# Patient Record
Sex: Male | Born: 1980 | Race: White | Hispanic: No | Marital: Single | Smoking: Never smoker
Health system: Southern US, Community
[De-identification: ages and names within clinical notes are randomized; demographics above are authoritative.]

## PROBLEM LIST (undated history)

## (undated) DIAGNOSIS — K5190 Ulcerative colitis, unspecified, without complications: Secondary | ICD-10-CM

## (undated) DIAGNOSIS — K6389 Other specified diseases of intestine: Secondary | ICD-10-CM

## (undated) DIAGNOSIS — K602 Anal fissure, unspecified: Secondary | ICD-10-CM

## (undated) DIAGNOSIS — J45909 Unspecified asthma, uncomplicated: Secondary | ICD-10-CM

## (undated) HISTORY — DX: Other specified diseases of intestine: K63.89

## (undated) HISTORY — PX: NO PAST SURGERIES: SHX2092

## (undated) HISTORY — DX: Anal fissure, unspecified: K60.2

## (undated) HISTORY — DX: Unspecified asthma, uncomplicated: J45.909

## (undated) HISTORY — DX: Ulcerative colitis, unspecified, without complications: K51.90

---

## 2005-02-01 ENCOUNTER — Ambulatory Visit (HOSPITAL_COMMUNITY): Admission: RE | Admit: 2005-02-01 | Discharge: 2005-02-01 | Payer: Self-pay | Admitting: Gastroenterology

## 2010-05-28 ENCOUNTER — Encounter: Payer: Self-pay | Admitting: Gastroenterology

## 2018-02-05 ENCOUNTER — Other Ambulatory Visit: Payer: Self-pay | Admitting: Occupational Medicine

## 2018-02-05 ENCOUNTER — Ambulatory Visit: Payer: Self-pay

## 2018-02-05 DIAGNOSIS — M25572 Pain in left ankle and joints of left foot: Secondary | ICD-10-CM

## 2019-07-29 ENCOUNTER — Encounter: Payer: Self-pay | Admitting: Gastroenterology

## 2019-08-04 ENCOUNTER — Encounter: Payer: Self-pay | Admitting: Gastroenterology

## 2019-08-04 ENCOUNTER — Ambulatory Visit: Payer: BLUE CROSS/BLUE SHIELD | Admitting: Gastroenterology

## 2019-08-04 VITALS — BP 108/70 | HR 72 | Temp 98.1°F | Ht 69.5 in | Wt 194.4 lb

## 2019-08-04 DIAGNOSIS — R152 Fecal urgency: Secondary | ICD-10-CM | POA: Diagnosis not present

## 2019-08-04 DIAGNOSIS — R194 Change in bowel habit: Secondary | ICD-10-CM

## 2019-08-04 DIAGNOSIS — K625 Hemorrhage of anus and rectum: Secondary | ICD-10-CM | POA: Diagnosis not present

## 2019-08-04 MED ORDER — NA SULFATE-K SULFATE-MG SULF 17.5-3.13-1.6 GM/177ML PO SOLN: 1.0000 | Freq: Once | ORAL | 0 refills | Status: AC

## 2019-08-04 NOTE — Patient Instructions (Addendum)
If you are age 40 or older, your body mass index should be between 23-30. Your Body mass index is 28.29 kg/m. If this is out of the aforementioned range listed, please consider follow up with your Primary Care Provider.  If you are age 40 or younger, your body mass index should be between 19-25. Your Body mass index is 28.29 kg/m. If this is out of the aformentioned range listed, please consider follow up with your Primary Care Provider.   We have sent the following medications to your pharmacy for you to pick up at your convenience:  Suprep (colonsocopy prep)  You have been scheduled for a colonoscopy. Please follow written instructions given to you at your visit today.  Please pick up your prep supplies at the pharmacy within the next 1-3 days. If you use inhalers (even only as needed), please bring them with you on the day of your procedure.  Due to recent changes in healthcare laws, you may see the results of your imaging and laboratory studies on MyChart before your provider has had a chance to review them.  We understand that in some cases there may be results that are confusing or concerning to you. Not all laboratory results come back in the same time frame and the provider may be waiting for multiple results in order to interpret others.  Please give [place] 48 hours in order for your provider to thoroughly review all the results before contacting the office for clarification of your results.

## 2019-08-04 NOTE — Progress Notes (Signed)
08/04/2019 Albert Morton [id number] Dec 21, 1980   HISTORY OF PRESENT ILLNESS: This is a pleasant 39 year old male who is new to our office.  He presents here today as a self-referral for complaints of rectal bleeding.  He tells me that he had a fissure several years ago.  Now for quite some time he will see bright red blood on the toilet paper upon wiping.  He says that sometimes he won't see it for a while, but then it comes back again.  Sometimes drips into the toilet bowl.  He tells me that 1 day last week while he was at work he felt he had have a bowel movement.  As he was walking to the bathroom he passed some gas and when he got there he noticed a moderate amount of blood in his underwear.  That was the only episode of bleeding to that degree.  He denies any rectal pain.  He denies any abdominal pain.  He really denies any diarrhea.  He says that he has had a change, however, as he used to have a bowel movement every day and lately over the past few months he will sometimes go a couple of days without a bowel movement, but then also describes urgency.  He says that when he has to go he really has to go.   Past Medical History:  Diagnosis Date  . Anal fissure   . Asthma      Past Surgical History:  Procedure Laterality Date  . NO PAST SURGERIES      reports that he has never smoked. He has never used smokeless tobacco. He reports that he does not drink alcohol or use drugs. family history includes Allergies in his son; Asthma in his son; Breast cancer in his maternal grandmother; Deep vein thrombosis in his mother; Diabetes in his father; Hypertension in his father; Prostate cancer in his maternal grandfather. No Known Allergies    No outpatient encounter medications on file as of 08/04/2019.   No facility-administered encounter medications on file as of 08/04/2019.     REVIEW OF SYSTEMS  : All other systems reviewed and negative except where noted in the History of Present  Illness.   PHYSICAL EXAM: BP 108/70 (BP Location: Left Arm, Patient Position: Sitting, Cuff Size: Normal)   Pulse 72   Temp 98.1 F (36.7 C)   Ht 5' 9.5" (1.765 m) Comment: height measured without shoes  Wt 194 lb 6 oz (88.2 kg)   BMI 28.29 kg/m  General: Well developed white male in no acute distress Head: Normocephalic and atraumatic Eyes:  Sclerae anicteric, conjunctiva pink. Ears: Normal auditory acuity Lungs: Clear throughout to auscultation; no increased WOB. Heart: Regular rate and rhythm; no M/R/G. Abdomen: Soft, non-distended.  BS present.  Non-tender. Rectal:  Will be done at the time of colonoscopy. Musculoskeletal: Symmetrical with no gross deformities  Skin: No lesions on visible extremities Extremities: No edema  Neurological: Alert oriented x 4, grossly non-focal Psychological:  Alert and cooperative. Normal mood and affect  ASSESSMENT AND PLAN: *Rectal bleeding:  Had one episode last week of passing a moderate amount of blood per rectum, but sees blood regularly when wiping. *Change in bowel habits:  Says that he used to have a BM every day, but now sometimes does not go for a day or so.  No diarrhea but a lot of urgency at times.    **Will plan for colonoscopy with Dr. Fuller Plan.  The risks, benefits, and  alternatives to colonoscopy were discussed with the patient and he consents to proceed.   CC:  No ref. provider found

## 2019-08-05 NOTE — Progress Notes (Signed)
Reviewed and agree with management plan.  Faithlyn Recktenwald T. Juliene Kirsh, MD FACG [facility]  

## 2019-08-07 ENCOUNTER — Ambulatory Visit: Payer: Self-pay | Admitting: Gastroenterology

## 2019-09-02 ENCOUNTER — Telehealth: Payer: Self-pay | Admitting: Gastroenterology

## 2019-09-02 MED ORDER — NA SULFATE-K SULFATE-MG SULF 17.5-3.13-1.6 GM/177ML PO SOLN: 1.0000 | Freq: Once | ORAL | 0 refills | Status: AC

## 2019-09-02 NOTE — Telephone Encounter (Signed)
Patient's wife informed that Suprep was sent in with a discount code that should decrease the price to $50. Wife voiced understanding.

## 2019-09-09 ENCOUNTER — Encounter: Payer: Self-pay | Admitting: Gastroenterology

## 2019-09-11 ENCOUNTER — Ambulatory Visit (INDEPENDENT_AMBULATORY_CARE_PROVIDER_SITE_OTHER): Payer: BC Managed Care – PPO

## 2019-09-11 ENCOUNTER — Other Ambulatory Visit: Payer: Self-pay | Admitting: Gastroenterology

## 2019-09-11 DIAGNOSIS — Z1159 Encounter for screening for other viral diseases: Secondary | ICD-10-CM

## 2019-09-11 LAB — SARS CORONAVIRUS 2 (TAT 6-24 HRS): SARS Coronavirus 2: NEGATIVE

## 2019-09-15 ENCOUNTER — Other Ambulatory Visit: Payer: Self-pay

## 2019-09-15 ENCOUNTER — Ambulatory Visit (AMBULATORY_SURGERY_CENTER): Payer: BC Managed Care – PPO | Admitting: Gastroenterology

## 2019-09-15 ENCOUNTER — Encounter: Payer: BLUE CROSS/BLUE SHIELD | Admitting: Gastroenterology

## 2019-09-15 ENCOUNTER — Encounter: Payer: Self-pay | Admitting: Gastroenterology

## 2019-09-15 VITALS — BP 108/65 | HR 68 | Temp 97.1°F | Resp 21 | Ht 69.0 in | Wt 194.0 lb

## 2019-09-15 DIAGNOSIS — K6389 Other specified diseases of intestine: Secondary | ICD-10-CM

## 2019-09-15 DIAGNOSIS — K52832 Lymphocytic colitis: Secondary | ICD-10-CM

## 2019-09-15 DIAGNOSIS — R152 Fecal urgency: Secondary | ICD-10-CM

## 2019-09-15 DIAGNOSIS — K921 Melena: Secondary | ICD-10-CM

## 2019-09-15 DIAGNOSIS — R194 Change in bowel habit: Secondary | ICD-10-CM | POA: Diagnosis not present

## 2019-09-15 DIAGNOSIS — K625 Hemorrhage of anus and rectum: Secondary | ICD-10-CM

## 2019-09-15 MED ORDER — SODIUM CHLORIDE 0.9 % IV SOLN
500.0000 mL | Freq: Once | INTRAVENOUS | Status: DC
Start: 2019-09-15 — End: 2019-09-15

## 2019-09-15 NOTE — Progress Notes (Signed)
Called to room to assist during endoscopic procedure.  Patient ID and intended procedure confirmed with present staff. Received instructions for my participation in the procedure from the performing physician.  

## 2019-09-15 NOTE — Progress Notes (Signed)
A and O x3. Report to RN. Tolerated MAC anesthesia well.

## 2019-09-15 NOTE — Op Note (Signed)
[facility] Patient Name: Albert Morton Procedure Date: 09/15/2019 10:11 AM MRN: [id number] Endoscopist: Meryl Dare , MD Age: 39 Referring MD:  Date of Birth: [birth year] Gender: Male Account #: [id number] Procedure:                Colonoscopy Indications:              Hematochezia, Change in bowel habits, Fecal urgency Medicines:                Monitored Anesthesia Care Procedure:                Pre-Anesthesia Assessment:                           - Prior to the procedure, a History and Physical                            was performed, and patient medications and                            allergies were reviewed. The patient's tolerance of                            previous anesthesia was also reviewed. The risks                            and benefits of the procedure and the sedation                            options and risks were discussed with the patient.                            All questions were answered, and informed consent                            was obtained. Prior Anticoagulants: The patient has                            taken no previous anticoagulant or antiplatelet                            agents. ASA Grade Assessment: II - A patient with                            mild systemic disease. After reviewing the risks                            and benefits, the patient was deemed in                            satisfactory condition to undergo the procedure.                           After obtaining informed consent, the colonoscope  was passed under direct vision. Throughout the                            procedure, the patient's blood pressure, pulse, and                            oxygen saturations were monitored continuously. The                            Colonoscope was introduced through the anus and                            advanced to the the terminal ileum, with                            identification of  the appendiceal orifice and IC                            valve. The terminal ileum, ileocecal valve,                            appendiceal orifice, and rectum were photographed.                            The quality of the bowel preparation was good. The                            colonoscopy was performed without difficulty. The                            patient tolerated the procedure well. Scope In: 10:15:57 AM Scope Out: 10:33:02 AM Scope Withdrawal Time: 0 hours 14 minutes 48 seconds  Total Procedure Duration: 0 hours 17 minutes 5 seconds  Findings:                 The perianal and digital rectal examinations were                            normal.                           The terminal ileum appeared normal.                           Diffuse moderate inflammation characterized by                            congestion (edema), erythema, friability and                            granularity was found in the recto-sigmoid colon to                            25 cm. Biopsies were taken with a cold forceps for  histology.                           Internal hemorrhoids were found during                            retroflexion. The hemorrhoids were small and Grade                            I (internal hemorrhoids that do not prolapse).                           The exam was otherwise without abnormality on                            direct and retroflexion views. Complications:            No immediate complications. Estimated blood loss:                            None. Estimated Blood Loss:     Estimated blood loss: none. 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 Biopsied.                           - Normal appearing terminal ileum.                           - Internal hemorrhoids.                           - The  examination was otherwise normal on direct                            and retroflexion views. Recommendation:           - Repeat colonoscopy after studies are complete for                            surveillance based on pathology results.                           - Patient has a contact number available for                            emergencies. The signs and symptoms of potential                            delayed complications were discussed with the  patient. Return to normal activities tomorrow.                            Written discharge instructions were provided to the                            patient.                           - Resume previous diet.                           - Continue present medications.                           - Await pathology results.                           - Return to GI office in 2 weeks with me or Doug Sou, PA. Meryl Dare, MD 09/15/2019 10:41:02 AM This report has been signed electronically.

## 2019-09-15 NOTE — Patient Instructions (Signed)
Please read handouts provided. Continue present medications. Await pathology results. Return to GI office in 2 weeks with Dr. Russella Dar or Doug Sou, PA.       YOU HAD AN ENDOSCOPIC PROCEDURE TODAY AT THE [facility]:   Refer to the procedure report that was given to you for any specific questions about what was found during the examination.  If the procedure report does not answer your questions, please call your gastroenterologist to clarify.  If you requested that your care partner not be given the details of your procedure findings, then the procedure report has been included in a sealed envelope for you to review at your convenience later.  YOU SHOULD EXPECT: Some feelings of bloating in the abdomen. Passage of more gas than usual.  Walking can help get rid of the air that was put into your GI tract during the procedure and reduce the bloating. If you had a lower endoscopy (such as a colonoscopy or flexible sigmoidoscopy) you may notice spotting of blood in your stool or on the toilet paper. If you underwent a bowel prep for your procedure, you may not have a normal bowel movement for a few days.  Please Note:  You might notice some irritation and congestion in your nose or some drainage.  This is from the oxygen used during your procedure.  There is no need for concern and it should clear up in a day or so.  SYMPTOMS TO REPORT IMMEDIATELY:   Following lower endoscopy (colonoscopy or flexible sigmoidoscopy):  Excessive amounts of blood in the stool  Significant tenderness or worsening of abdominal pains  Swelling of the abdomen that is new, acute  Fever of 100F or higher   For urgent or emergent issues, a gastroenterologist can be reached at any hour by calling [phone]. Do not use MyChart messaging for urgent concerns.    DIET:  We do recommend a small meal at first, but then you may proceed to your regular diet.  Drink plenty of fluids but you should avoid  alcoholic beverages for 24 hours.  ACTIVITY:  You should plan to take it easy for the rest of today and you should NOT DRIVE or use heavy machinery until tomorrow (because of the sedation medicines used during the test).    FOLLOW UP: Our staff will call the number listed on your records 48-72 hours following your procedure to check on you and address any questions or concerns that you may have regarding the information given to you following your procedure. If we do not reach you, we will leave a message.  We will attempt to reach you two times.  During this call, we will ask if you have developed any symptoms of COVID 19. If you develop any symptoms (ie: fever, flu-like symptoms, shortness of breath, cough etc.) before then, please call [phone].  If you test positive for Covid 19 in the 2 weeks post procedure, please call and report this information to [place].    If any biopsies were taken you will be contacted by phone or by letter within the next 1-3 weeks.  Please call us at [phone] if you have not heard about the biopsies in 3 weeks.    SIGNATURES/CONFIDENTIALITY: You and/or your care partner have signed paperwork which will be entered into your electronic medical record.  These signatures attest to the fact that that the information above on your After Visit Summary has been reviewed and is understood.  Full responsibility of  the confidentiality of this discharge information lies with you and/or your care-partner. 

## 2019-09-15 NOTE — Progress Notes (Signed)
Pt's states no medical or surgical changes since previsit or office visit. 

## 2019-09-17 ENCOUNTER — Encounter: Payer: BC Managed Care – PPO | Admitting: Gastroenterology

## 2019-09-17 ENCOUNTER — Telehealth: Payer: Self-pay | Admitting: *Deleted

## 2019-09-17 NOTE — Telephone Encounter (Signed)
  Follow up Call-  Call back number 09/15/2019  Post procedure Call Back phone  # [phone]  Permission to leave phone message Yes  Some recent data might be hidden     Patient questions:  Do you have a fever, pain , or abdominal swelling? No. Pain Score  0   Have you tolerated food without any problems? Yes.    Have you been able to return to your normal activities? Yes.    Do you have any questions about your discharge instructions: Diet   No. Medications  No. Follow up visit  No.  Do you have questions or concerns about your Care? No.  Actions: * If pain score is 4 or above: No action needed, pain <4  1. Have you developed a fever since your procedure? NO  2.   Have you had an respiratory symptoms (SOB or cough) since your procedure? NO  3.   Have you tested positive for COVID 19 since your procedure NO  4.   Have you had any family members/close contacts diagnosed with the COVID 19 since your procedure?  NO   If yes to any of these questions please route to Laverna Peace, RN and Charlett Lango, RN

## 2019-10-03 ENCOUNTER — Other Ambulatory Visit: Payer: Self-pay

## 2019-10-03 MED ORDER — MESALAMINE 1.2 G PO TBEC
2.4000 g | DELAYED_RELEASE_TABLET | Freq: Every day | ORAL | 11 refills | Status: AC
Start: 2019-10-03 — End: ?

## 2019-10-09 ENCOUNTER — Ambulatory Visit (INDEPENDENT_AMBULATORY_CARE_PROVIDER_SITE_OTHER): Payer: BC Managed Care – PPO | Admitting: Gastroenterology

## 2019-10-09 ENCOUNTER — Other Ambulatory Visit (INDEPENDENT_AMBULATORY_CARE_PROVIDER_SITE_OTHER): Payer: BC Managed Care – PPO

## 2019-10-09 ENCOUNTER — Encounter: Payer: Self-pay | Admitting: Gastroenterology

## 2019-10-09 VITALS — BP 100/60 | HR 64 | Ht 69.5 in | Wt 178.0 lb

## 2019-10-09 DIAGNOSIS — K6389 Other specified diseases of intestine: Secondary | ICD-10-CM

## 2019-10-09 LAB — CBC WITH DIFFERENTIAL/PLATELET
Basophils Absolute: 0.1 10*3/uL (ref 0.0–0.1)
Basophils Relative: 0.8 % (ref 0.0–3.0)
Eosinophils Absolute: 0.1 10*3/uL (ref 0.0–0.7)
Eosinophils Relative: 2.4 % (ref 0.0–5.0)
HCT: 45.5 % (ref 39.0–52.0)
Hemoglobin: 15.9 g/dL (ref 13.0–17.0)
Lymphocytes Relative: 42.3 % (ref 12.0–46.0)
Lymphs Abs: 2.6 10*3/uL (ref 0.7–4.0)
MCHC: 34.9 g/dL (ref 30.0–36.0)
MCV: 91 fl (ref 78.0–100.0)
Monocytes Absolute: 0.5 10*3/uL (ref 0.1–1.0)
Monocytes Relative: 8.8 % (ref 3.0–12.0)
Neutro Abs: 2.8 10*3/uL (ref 1.4–7.7)
Neutrophils Relative %: 45.7 % (ref 43.0–77.0)
Platelets: 199 10*3/uL (ref 150.0–400.0)
RBC: 5 Mil/uL (ref 4.22–5.81)
RDW: 12.8 % (ref 11.5–15.5)
WBC: 6.1 10*3/uL (ref 4.0–10.5)

## 2019-10-09 LAB — COMPREHENSIVE METABOLIC PANEL
ALT: 16 U/L (ref 0–53)
AST: 13 U/L (ref 0–37)
Albumin: 4.4 g/dL (ref 3.5–5.2)
Alkaline Phosphatase: 68 U/L (ref 39–117)
BUN: 17 mg/dL (ref 6–23)
CO2: 32 mEq/L (ref 19–32)
Calcium: 9.4 mg/dL (ref 8.4–10.5)
Chloride: 104 mEq/L (ref 96–112)
Creatinine, Ser: 1.15 mg/dL (ref 0.40–1.50)
GFR: 70.61 mL/min (ref >60.00)
Glucose, Bld: 106 mg/dL — ABNORMAL HIGH (ref 70–99)
Potassium: 4.7 mEq/L (ref 3.5–5.1)
Sodium: 139 mEq/L (ref 135–145)
Total Bilirubin: 0.7 mg/dL (ref 0.2–1.2)
Total Protein: 6.7 g/dL (ref 6.0–8.3)

## 2019-10-09 NOTE — Progress Notes (Signed)
10/09/2019 Albert Morton [id number] 1980/05/31   HISTORY OF PRESENT ILLNESS: This is a 39 year old male who is a patient recently established with Dr. Russella Dar.  He is here for follow-up after colonoscopy in May.  I saw him for an initial visit on August 04, 2019.  Please see that note for further details regarding his symptoms.  Nonetheless, he was having rectal bleeding and had a change in bowel habits with urgency.  Colonoscopy on Sep 15, 2019 showed moderate inflammation in the rectosigmoid area suggestive of ulcerative proctosigmoiditis.  Also had small, grade 1 internal hemorrhoids.  Interestingly, pathology results showed lymphocytic colitis.  Nonetheless, Dr. Ardell Isaacs suggested that he start Lialda 2.4 g daily.  The patient tells me that he has not yet picked up that prescription and wanted to wait until this appointment first to get his questions answered and to discuss.  Past Medical History:  Diagnosis Date  . Anal fissure   . Asthma   . Proctosigmoiditis   . Ulcerative colitis [facility])    Past Surgical History:  Procedure Laterality Date  . NO PAST SURGERIES      reports that he has never smoked. He has never used smokeless tobacco. He reports that he does not drink alcohol or use drugs. family history includes Allergies in his son; Asthma in his son; Breast cancer in his maternal grandmother; Deep vein thrombosis in his mother; Diabetes in his father; Hypertension in his father; Prostate cancer in his maternal grandfather. No Known Allergies    Outpatient Encounter Medications as of 10/09/2019  Medication Sig  . mesalamine (LIALDA) 1.2 g EC tablet Take 2 tablets (2.4 g total) by mouth daily with breakfast. (Patient not taking: Reported on 10/09/2019)   No facility-administered encounter medications on file as of 10/09/2019.     REVIEW OF SYSTEMS  : All other systems reviewed and negative except where noted in the History of Present Illness.   PHYSICAL EXAM: BP 100/60 (BP  Location: Left Arm, Patient Position: Sitting, Cuff Size: Normal)   Pulse 64   Ht 5' 9.5" (1.765 m)   Wt 178 lb (80.7 kg)   BMI 25.91 kg/m  General: Well developed white male in no acute distress Head: Normocephalic and atraumatic Eyes:  Sclerae anicteric, conjunctiva pink. Ears: Normal auditory acuity Lungs: Clear throughout to auscultation; no increased WOB. Heart: Regular rate and rhythm; no M/R/G. Abdomen: Soft, non-distended.  BS present.  Non-tender. Musculoskeletal: Symmetrical with no gross deformities  Skin: No lesions on visible extremities Extremities: No edema  Neurological: Alert oriented x 4, grossly non-focal Psychological:  Alert and cooperative. Normal mood and affect  ASSESSMENT AND PLAN: *39 year old male here for follow-up after colonoscopy.  Had moderate inflammation in the rectosigmoid colon suspicious for ulcerative proctosigmoiditis.  Pathology biopsies suggested lymphocytic colitis.  He was started on Lialda 2.4 g daily, but says that he has not yet picked up the prescription from the pharmacy and wanted to wait to discuss at this visit first.  At the end of his visit I answered all of his questions and he was agreeable to obtain medication and begin taking it daily.  I actually did send a message to the pathologist to see if she could possibly review the specimen again to see if there were any pathologic changes suggestive of inflammatory bowel disease.  We will check a CBC and CMP today as we do not have any baseline labs on him.  He will begin taking the Lialda 2.4 g  daily and will call us back in about 6 to 8 weeks to give [place] an update on his symptoms.  If he is doing well with the medication then we we will plan to see him back yearly or sooner as needed for any flares or other issues.   CC:  No ref. provider found

## 2019-10-09 NOTE — Patient Instructions (Signed)
Begin taking your Lialda.  Call back in 6-8 weeks with an update - ask for [employer] or Darcey Nora.  If you are doing well, follow up in one year or sooner as needed

## 2019-10-11 IMAGING — DX DG ANKLE COMPLETE 3+V*L*
3 series · 3 of 3 positions shown · non-contrast
Comparison: None.

CLINICAL DATA: Recent twisting injury with lateral ankle pain,
initial encounter

EXAM:
LEFT ANKLE COMPLETE - 3+ VIEW

[ankle ap]
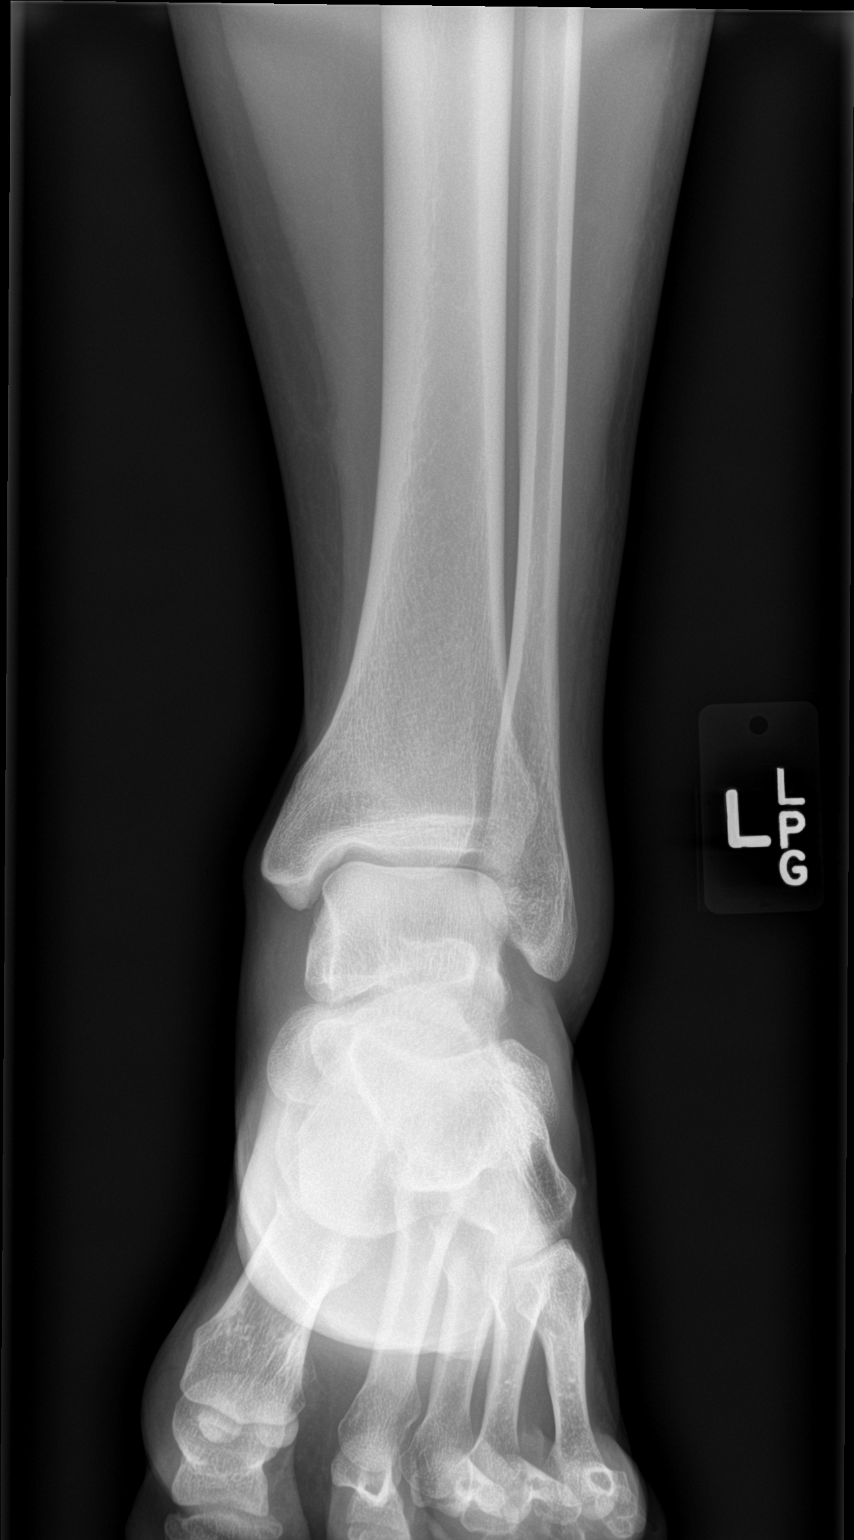

[ankle mortise]
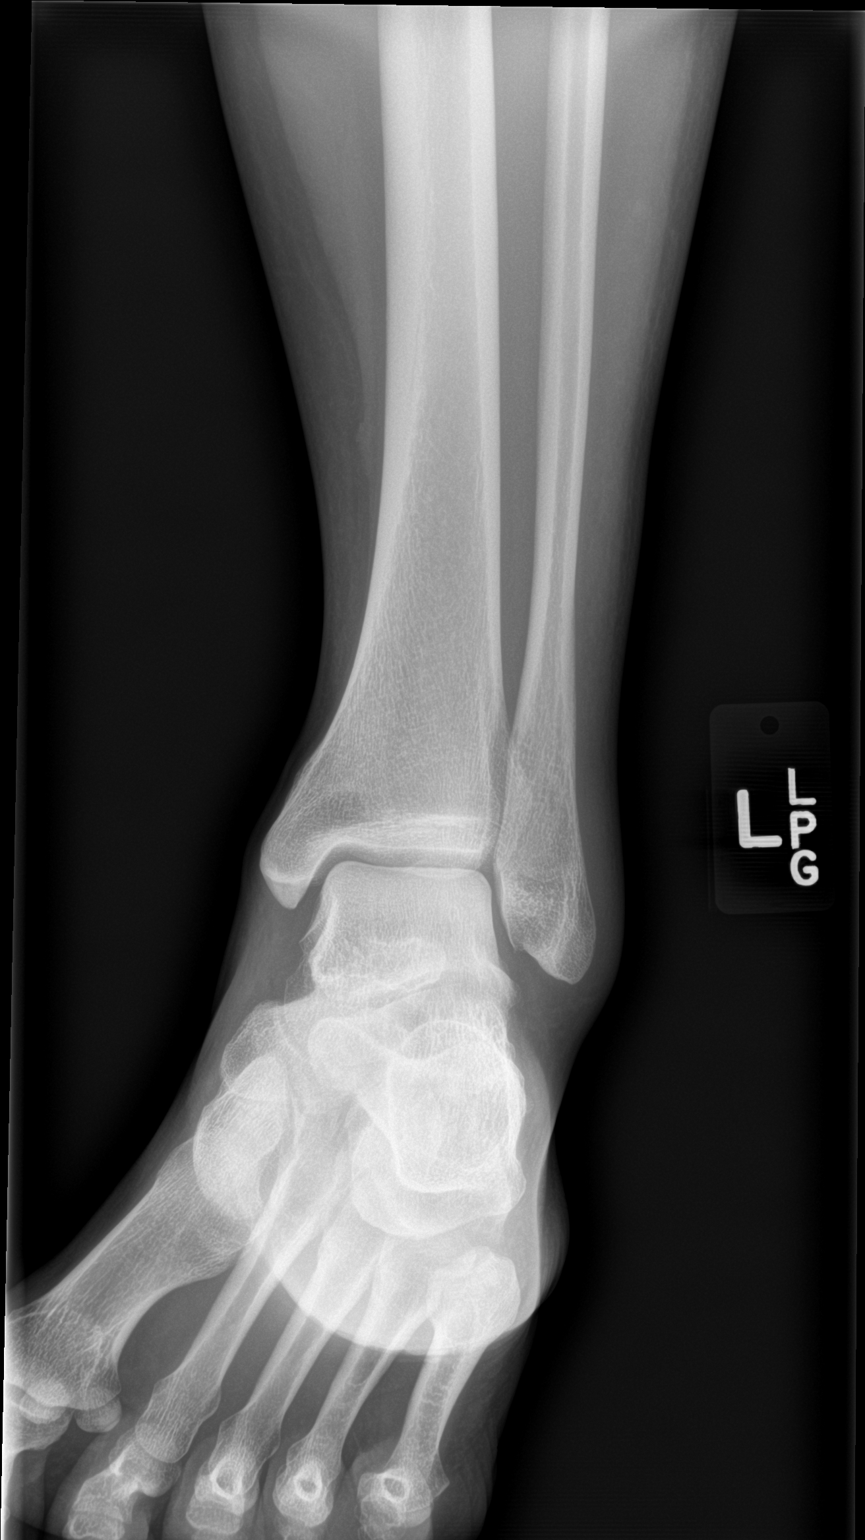

[ankle lat]
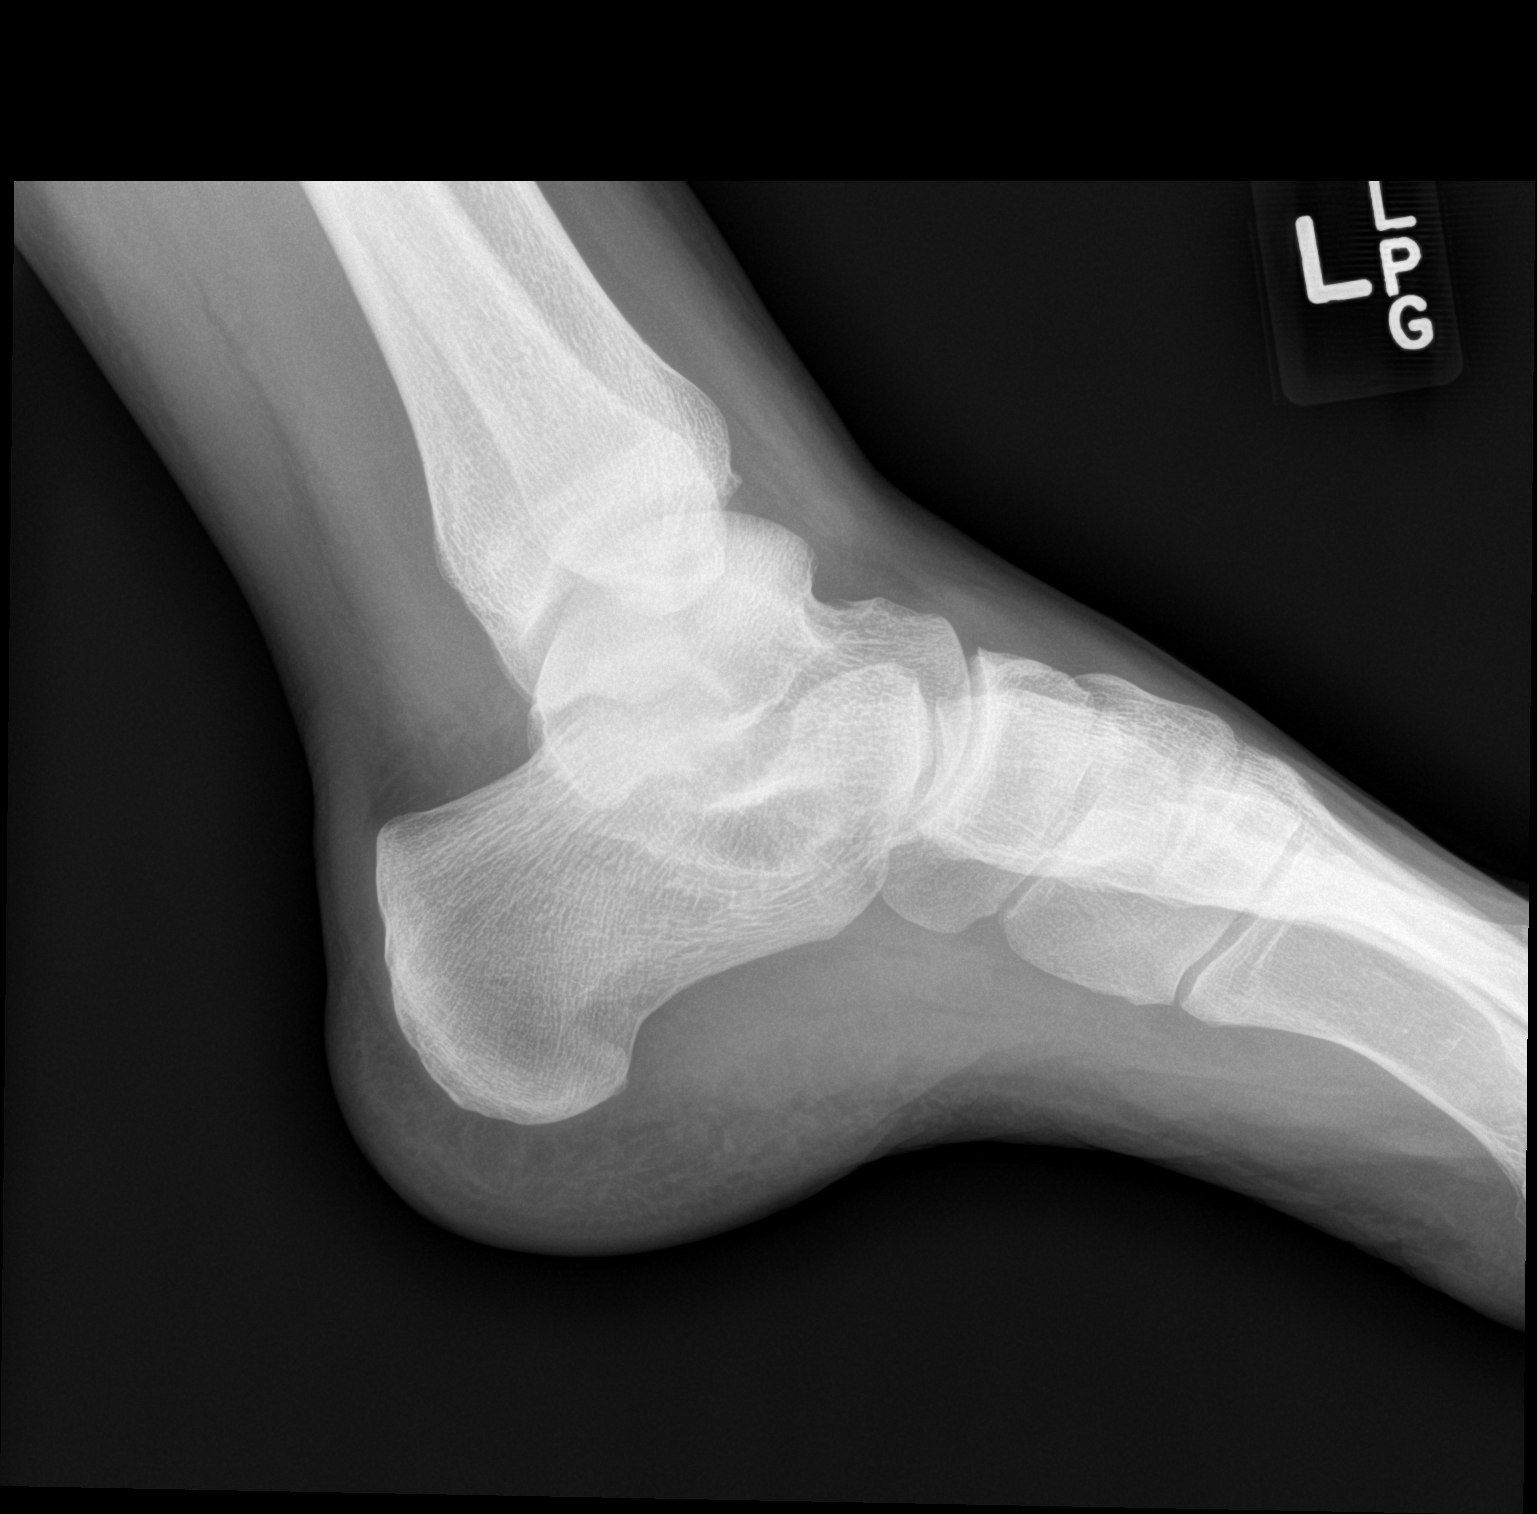

[3 of 3 positions shown; findings below may reference images not displayed]

FINDINGS: Lateral soft tissue swelling is noted. No acute fracture or
dislocation is seen. No other focal abnormality is noted.
IMPRESSION: Lateral soft tissue swelling without acute bony abnormality.

## 2019-10-12 NOTE — Progress Notes (Signed)
Reviewed and agree with management plan.  Karma Ansley T. Asiah Browder, MD FACG [facility]
